# Patient Record
Sex: Male | Born: 1990 | Race: Black or African American | Hispanic: No | Marital: Single | Smoking: Current every day smoker
Health system: Southern US, Community
[De-identification: ages and names within clinical notes are randomized; demographics above are authoritative.]

## PROBLEM LIST (undated history)

## (undated) DIAGNOSIS — F17200 Nicotine dependence, unspecified, uncomplicated: Secondary | ICD-10-CM

## (undated) HISTORY — DX: Nicotine dependence, unspecified, uncomplicated: F17.200

## (undated) HISTORY — PX: FRACTURE SURGERY: SHX138

---
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
## 2009-01-16 ENCOUNTER — Emergency Department: Payer: Self-pay | Admitting: Emergency Medicine

## 2009-12-21 ENCOUNTER — Emergency Department: Payer: Self-pay | Admitting: Emergency Medicine

## 2010-01-23 ENCOUNTER — Emergency Department: Payer: Self-pay | Admitting: Unknown Physician Specialty

## 2010-07-24 ENCOUNTER — Emergency Department: Payer: Self-pay | Admitting: Emergency Medicine
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
## 2021-03-12 DIAGNOSIS — M87052 Idiopathic aseptic necrosis of left femur: Secondary | ICD-10-CM | POA: Insufficient documentation

## 2022-04-30 ENCOUNTER — Ambulatory Visit: Payer: BLUE CROSS/BLUE SHIELD | Admitting: Nurse Practitioner

## 2022-04-30 ENCOUNTER — Encounter: Payer: Self-pay | Admitting: Nurse Practitioner

## 2022-04-30 DIAGNOSIS — Z113 Encounter for screening for infections with a predominantly sexual mode of transmission: Secondary | ICD-10-CM

## 2022-04-30 LAB — HM HEPATITIS C SCREENING LAB: HM Hepatitis Screen: NEGATIVE

## 2022-04-30 LAB — HEPATITIS B SURFACE ANTIGEN: Hepatitis B Surface Ag: NONREACTIVE

## 2022-04-30 LAB — HM HIV SCREENING LAB: HM HIV Screening: NEGATIVE

## 2022-04-30 NOTE — Progress Notes (Signed)
[facility] Department STI clinic/screening visit  Subjective:  Dustin Obrien is a 31 y.o. male being seen today for an STI screening visit. The patient reports they do not have symptoms.    Patient has the following medical conditions:  There are no problems to display for this patient.    Chief Complaint  Patient presents with   SEXUALLY TRANSMITTED DISEASE    Screening    HPI  Patient reports to clinic for STD screening.  Patient is asymptomatic.    Does the patient or their partner desires a pregnancy in the next year? No  Screening for MPX risk: Does the patient have an unexplained rash? No Is the patient MSM? No Does the patient endorse multiple sex partners or anonymous sex partners? No Did the patient have close or sexual contact with a person diagnosed with MPX? No Has the patient traveled outside the [place] where MPX is endemic? No Is there a high clinical suspicion for MPX-- evidenced by one of the following No  -Unlikely to be chickenpox  -Lymphadenopathy  -Rash that present in same phase of evolution on any given body part   See flowsheet for further details and programmatic requirements.   Immunization History  Administered Date(s) Administered   Hepatitis B 08/02/2003, 09/20/2003   Tdap 05/31/2007     The following portions of the patient's history were reviewed and updated as appropriate: allergies, current medications, past medical history, past social history, past surgical history and problem list.  Objective:  There were no vitals filed for this visit.  Physical Exam Constitutional:      Appearance: Normal appearance.  HENT:     Head: Normocephalic. No abrasion, masses or laceration. Hair is normal.     Mouth/Throat:     Mouth: No oral lesions.     Dentition: No dental caries.     Pharynx: No pharyngeal swelling, oropharyngeal exudate, posterior oropharyngeal erythema or uvula swelling.     Tonsils: No tonsillar exudate or  tonsillar abscesses.  Eyes:     General: Lids are normal.        Right eye: No discharge.        Left eye: No discharge.     Conjunctiva/sclera: Conjunctivae normal.     Right eye: No exudate.    Left eye: No exudate. Abdominal:     General: Abdomen is flat.     Palpations: Abdomen is soft.     Tenderness: There is no abdominal tenderness. There is no rebound.  Genitourinary:    Pubic Area: No rash or pubic lice.      Penis: Normal and circumcised. No erythema or discharge.      Testes: Normal.        Right: Mass or tenderness not present.        Left: Mass or tenderness not present.     Rectum: Normal.     Comments: Discharge amount: None Odor: None  Musculoskeletal:     Cervical back: Full passive range of motion without pain, normal range of motion and neck supple.  Lymphadenopathy:     Cervical: No cervical adenopathy.     Right cervical: No superficial, deep or posterior cervical adenopathy.    Left cervical: No superficial, deep or posterior cervical adenopathy.     Upper Body:     Right upper body: No supraclavicular, axillary or epitrochlear adenopathy.     Left upper body: No supraclavicular, axillary or epitrochlear adenopathy.     Lower Body: No  right inguinal adenopathy. No left inguinal adenopathy.  Skin:    General: Skin is warm and dry.     Findings: No lesion or rash.  Neurological:     Mental Status: He is alert and oriented to person, place, and time.  Psychiatric:        Attention and Perception: Attention normal.        Mood and Affect: Mood normal.        Speech: Speech normal.        Behavior: Behavior normal. Behavior is cooperative.       Assessment and Plan:  Dustin Obrien is a 31 y.o. male presenting to the [facility] Department for STI screening  1. Screening examination for venereal disease -31 year old male in clinic today for STD screening. -Patient does not have STI symptoms Patient accepted all screenings including   oral GC,  urine Patient meets criteria for HepB screening? Yes. Ordered? Yes Patient meets criteria for HepC screening? Yes. Ordered? Yes Recommended condom use with all sex Discussed importance of condom use for STI prevent   Discussed time line for State [facility] results and that patient will be called with positive results and encouraged patient to call if he had not heard in 2 weeks Recommended returning for continued or worsening symptoms.    - HIV/HCV [facility] - Syphilis Serology, [facility] - HBV Antigen/Antibody [facility] - Gonococcus culture - Chlamydia/GC NAA, Confirmation     Return if symptoms worsen or fail to improve.    Glenna Fellows, FNP

## 2022-05-03 LAB — CHLAMYDIA/GC NAA, CONFIRMATION
Chlamydia trachomatis, NAA: NEGATIVE
Neisseria gonorrhoeae, NAA: NEGATIVE

## 2022-05-04 LAB — GONOCOCCUS CULTURE

## 2024-01-31 ENCOUNTER — Ambulatory Visit: Payer: Self-pay
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
## 2024-02-01 ENCOUNTER — Encounter: Payer: Self-pay | Admitting: Family Medicine

## 2024-02-01 ENCOUNTER — Ambulatory Visit (INDEPENDENT_AMBULATORY_CARE_PROVIDER_SITE_OTHER): Admitting: Family Medicine

## 2024-02-01 VITALS — BP 125/81 | HR 87 | Ht 66.0 in | Wt 153.4 lb

## 2024-02-01 DIAGNOSIS — M87052 Idiopathic aseptic necrosis of left femur: Secondary | ICD-10-CM

## 2024-02-01 DIAGNOSIS — Z7689 Persons encountering health services in other specified circumstances: Secondary | ICD-10-CM

## 2024-02-01 MED ORDER — MELOXICAM 15 MG PO TABS: 15.0000 mg | ORAL_TABLET | Freq: Every day | ORAL | 0 refills | Status: DC

## 2024-02-01 MED ORDER — TRAMADOL HCL 50 MG PO TABS: 50.0000 mg | ORAL_TABLET | Freq: Four times a day (QID) | ORAL | 0 refills | Status: DC | PRN

## 2024-02-01 NOTE — Patient Instructions (Addendum)
 Tylenol: 1000 mg every 6 hours up to 3 times daily (3000mg  or 3g) as needed   Meloxicam: 15mg  once daily  Do not take with other NSAIDs (ibuprofen/Advil).  Tramadol: pain medication taken as needed     MyChart:  For all urgent or time sensitive needs we ask that you please call the office to avoid delays. Our number is [phone]. MyChart is not constantly monitored and due to the large volume of messages a day, replies may take up to 72 business hours.   MyChart Policy: MyChart allows for you to see your visit notes, after visit summary, provider recommendations, lab and tests results, make an appointment, request refills, and contact your provider or the office for non-urgent questions or concerns. Providers are seeing patients during normal business hours and do not have built in time to review MyChart messages.  We ask that you allow a minimum of 3 business days for responses to [employer]. For this reason, please do not send urgent requests through MyChart. Please call the office at [phone]. New and ongoing conditions may require a visit. We have virtual and in person visit available for your convenience.  Complex MyChart concerns may require a visit. Your provider may request you schedule a virtual or in person visit to ensure we are providing the best care possible. MyChart messages sent after 11:00 AM on Friday will not be received by the provider until Monday morning.    Lab and Test Results: You will receive your lab and test results on MyChart as soon as they are completed and results have been sent by the lab or testing facility. Due to this service, you will receive your results BEFORE your provider.  I review lab and tests results each morning prior to seeing patients. Some results require collaboration with other providers to ensure you are receiving the most appropriate care. For this reason, we ask that you please allow a minimum of 3-5 business days from the  time the ALL results have been received for your provider to receive and review lab and test results and contact you about these.  Most lab and test result comments from the provider will be sent through MyChart. Your provider may recommend changes to the plan of care, follow-up visits, repeat testing, ask questions, or request an office visit to discuss these results. You may reply directly to this message or call the office at [phone] to provide information for the provider or set up an appointment. In some instances, you will be called with test results and recommendations. Please let us know if this is preferred and we will make note of this in your chart to provide this for you.    If you have not heard a response to your lab or test results in 5 business days from all results returning to MyChart, please call the office to let us know. We ask that you please avoid calling prior to this time unless there is an emergent concern. Due to high call volumes, this can delay the resulting process.   After Hours: For all non-emergency after hours needs, please call the office at [phone] and select the option to reach the on-call provider service. On-call services are shared between multiple [place] offices and therefore it will not be possible to speak directly with your provider. On-call providers may provide medical advice and recommendations, but are unable to provide refills for maintenance medications.  For all emergency or urgent medical needs after normal business  hours, we recommend that you seek care at the closest Urgent Care or Emergency Department to ensure appropriate treatment in a timely manner.  [facility] [place] has a 24 hour emergency room located on the ground floor for your convenience.    Urgent Concerns During the Business Day Providers are seeing patients from 8AM to 5PM, Monday through Thursday, and 8AM to 12PM on Friday with a busy schedule and are most  often not able to respond to non-urgent calls until the end of the day or the next business day. If you should have URGENT concerns during the day, please call and speak to the nurse or schedule a same day appointment so that we can address your concern without delay.    Thank you, again, for choosing me as your health care partner. I appreciate your trust and look forward to learning more about you.    Alyson Reedy, FNP-C
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
## 2024-02-07 ENCOUNTER — Other Ambulatory Visit: Payer: Self-pay | Admitting: Family Medicine

## 2024-02-07 DIAGNOSIS — M87052 Idiopathic aseptic necrosis of left femur: Secondary | ICD-10-CM

## 2024-02-09 ENCOUNTER — Other Ambulatory Visit: Payer: Self-pay | Admitting: Family Medicine

## 2024-02-09 DIAGNOSIS — M87052 Idiopathic aseptic necrosis of left femur: Secondary | ICD-10-CM

## 2024-02-09 NOTE — Telephone Encounter (Signed)
 Copied from CRM [id number]. Topic: Clinical - Medication Refill >> Feb 09, 2024  1:41 PM Clayton Bibles wrote: Most Recent Primary Care Visit:  Provider: Alyson Reedy  Department: [facility]  Visit Type: NEW PT - OFFICE VISIT  Date: 02/01/2024  Medication: traMADol (ULTRAM) 50 MG tablet  Has the patient contacted their pharmacy? Yes (Agent: If no, request that the patient contact the pharmacy for the refill. If patient does not wish to contact the pharmacy document the reason why and proceed with request.) (Agent: If yes, when and what did the pharmacy advise?) Pharmacy needs order to refill  Is this the correct pharmacy for this prescription? Yes If no, delete pharmacy and type the correct one.  This is the patient's preferred pharmacy:  [id number] - [place], [place] - [address] [place] [address] Phone: [phone] Fax: [phone]   Has the prescription been filled recently? Yes  Is the patient out of the medication? Yes - He has been out since 02/06/24  Has the patient been seen for an appointment in the last year OR does the patient have an upcoming appointment? Yes  Can we respond through MyChart? Yes - Please send a message through MyChart when completed.   Agent: Please be advised that Rx refills may take up to 3 business days. We ask that you follow-up with your pharmacy.

## 2024-02-10 ENCOUNTER — Telehealth: Payer: Self-pay | Admitting: Family Medicine

## 2024-02-10 MED ORDER — TRAMADOL HCL 50 MG PO TABS: ORAL_TABLET | ORAL | 0 refills | Status: DC

## 2024-02-10 NOTE — Telephone Encounter (Signed)
 Error

## 2024-02-21 ENCOUNTER — Telehealth: Payer: Self-pay

## 2024-02-21 NOTE — Telephone Encounter (Signed)
-----   Message from Dustin Obrien sent at 02/21/2024  4:13 PM EDT ----- Regarding: Ortho Referral What is the status of this patient's ortho referral to [facility] or [facility]? Thanks!

## 2024-02-22 ENCOUNTER — Other Ambulatory Visit: Payer: Self-pay | Admitting: Family Medicine

## 2024-02-22 ENCOUNTER — Telehealth (HOSPITAL_BASED_OUTPATIENT_CLINIC_OR_DEPARTMENT_OTHER): Payer: Self-pay | Admitting: Family Medicine

## 2024-02-22 ENCOUNTER — Telehealth: Payer: Self-pay

## 2024-02-22 DIAGNOSIS — M87052 Idiopathic aseptic necrosis of left femur: Secondary | ICD-10-CM

## 2024-02-22 MED ORDER — MELOXICAM 15 MG PO TABS: 15.0000 mg | ORAL_TABLET | Freq: Every day | ORAL | 0 refills | Status: DC

## 2024-02-22 MED ORDER — TRAMADOL HCL 100 MG PO TABS: 100.0000 mg | ORAL_TABLET | Freq: Four times a day (QID) | ORAL | 0 refills | Status: DC | PRN

## 2024-02-22 NOTE — Telephone Encounter (Signed)
 Patient states he was contacted by Dustin Obrien yesterday and has scheduled for 02/28/24 at 3 pm

## 2024-02-22 NOTE — Telephone Encounter (Signed)
-----   Message from [place] sent at 02/22/2024 10:09 AM EDT ----- Regarding: RE: Ortho Referral Left Message--for someone to call me regarding the referral ----- Message ----- From: Wilhelmena Hanson, FNP Sent: 02/21/2024   4:14 PM EDT To: Chaya Cord Isom; Betsy May; Evans Him, CMA Subject: Ortho Referral                                 What is the status of this patient's ortho referral to [facility]? Thanks!

## 2024-02-22 NOTE — Telephone Encounter (Signed)
 PDMP reviewed, no red flags present. Increased tramadol  from 50mg  to 100mg  Q6 hours. Looking into ortho referral.

## 2024-02-22 NOTE — Telephone Encounter (Signed)
 [facility] called back her name is Howell Macintosh [phone] she said she couldnt make out the patients name please contact

## 2024-02-22 NOTE — Telephone Encounter (Signed)
(  [facility]man)  PA Case ID #: [id number]  Rx #: [id number]   Status Sent to Plan today  Drug traMADol  HCl 100MG  tablets   Form [facility] Electronic Prior Authorization Request Form

## 2024-02-23 NOTE — Telephone Encounter (Signed)
 Patient has an appointment in [facility] for follow up

## 2024-02-23 NOTE — Telephone Encounter (Signed)
 Patient is sch for a follow up in [place]

## 2024-02-24 NOTE — Telephone Encounter (Signed)
 Approved on April 23 by [facility] [employer] [facility] 2017  Approved. Approved for TRAMADOL  HCL Tablet 100MG , quantity up to 50 per 13 days, under the pharmacy benefit.   The drug has been approved from 02/22/2024 to 03/06/2024.   PA Case ID #: [id number] Rx #: [id number]

## 2024-03-06 ENCOUNTER — Telehealth: Payer: Self-pay

## 2024-03-06 NOTE — Telephone Encounter (Signed)
 PA Case ID #: [id number]  Rx #: [id number]  Need Help? Call us  at [phone]  Status Sent to Plan today  Drug traMADol  HCl 100MG  tablets  Form [facility] Electronic Prior Authorization Request Form

## 2024-04-02 ENCOUNTER — Other Ambulatory Visit: Payer: Self-pay | Admitting: Family Medicine

## 2024-04-02 DIAGNOSIS — M87052 Idiopathic aseptic necrosis of left femur: Secondary | ICD-10-CM
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
## 2024-04-25 ENCOUNTER — Ambulatory Visit (INDEPENDENT_AMBULATORY_CARE_PROVIDER_SITE_OTHER): Admitting: Family Medicine

## 2024-04-25 ENCOUNTER — Encounter: Payer: Self-pay | Admitting: Family Medicine

## 2024-04-25 ENCOUNTER — Ambulatory Visit

## 2024-04-25 VITALS — BP 123/80 | HR 66 | Temp 98.3°F | Wt 140.0 lb

## 2024-04-25 DIAGNOSIS — F17200 Nicotine dependence, unspecified, uncomplicated: Secondary | ICD-10-CM | POA: Diagnosis not present

## 2024-04-25 DIAGNOSIS — Z7689 Persons encountering health services in other specified circumstances: Secondary | ICD-10-CM

## 2024-04-25 DIAGNOSIS — Z0283 Encounter for blood-alcohol and blood-drug test: Secondary | ICD-10-CM | POA: Diagnosis not present

## 2024-04-25 DIAGNOSIS — M87052 Idiopathic aseptic necrosis of left femur: Secondary | ICD-10-CM

## 2024-04-25 MED ORDER — MELOXICAM 15 MG PO TABS: 15.0000 mg | ORAL_TABLET | Freq: Every day | ORAL | 2 refills | Status: AC

## 2024-04-25 MED ORDER — TRAMADOL HCL 100 MG PO TABS: 100.0000 mg | ORAL_TABLET | Freq: Two times a day (BID) | ORAL | 0 refills | Status: AC | PRN

## 2024-04-25 MED ORDER — VARENICLINE TARTRATE (STARTER) 0.5 MG X 11 & 1 MG X 42 PO TBPK: ORAL_TABLET | ORAL | 0 refills | Status: DC
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
## 2024-04-25 NOTE — Patient Instructions (Signed)

## 2024-04-26 LAB — CBC WITH DIFFERENTIAL/PLATELET
Basophils Absolute: 0 10*3/uL (ref 0.0–0.2)
Basos: 1 %
EOS (ABSOLUTE): 0.1 10*3/uL (ref 0.0–0.4)
Eos: 2 %
Hematocrit: 41.6 % (ref 37.5–51.0)
Hemoglobin: 13.7 g/dL (ref 13.0–17.7)
Immature Grans (Abs): 0 10*3/uL (ref 0.0–0.1)
Immature Granulocytes: 0 %
Lymphocytes Absolute: 2.4 10*3/uL (ref 0.7–3.1)
Lymphs: 48 %
MCH: 30.5 pg (ref 26.6–33.0)
MCHC: 32.9 g/dL (ref 31.5–35.7)
MCV: 93 fL (ref 79–97)
Monocytes Absolute: 0.3 10*3/uL (ref 0.1–0.9)
Monocytes: 6 %
Neutrophils Absolute: 2.1 10*3/uL (ref 1.4–7.0)
Neutrophils: 43 %
Platelets: 342 10*3/uL (ref 150–450)
RBC: 4.49 x10E6/uL (ref 4.14–5.80)
RDW: 14.1 % (ref 11.6–15.4)
WBC: 4.9 10*3/uL (ref 3.4–10.8)

## 2024-04-26 LAB — LIPID PANEL
Chol/HDL Ratio: 3.1 ratio (ref 0.0–5.0)
Cholesterol, Total: 137 mg/dL (ref 100–199)
HDL: 44 mg/dL (ref >39)
LDL Chol Calc (NIH): 73 mg/dL (ref 0–99)
Triglycerides: 106 mg/dL (ref 0–149)
VLDL Cholesterol Cal: 20 mg/dL (ref 5–40)

## 2024-04-26 LAB — COMPREHENSIVE METABOLIC PANEL WITH GFR
ALT: 14 IU/L (ref 0–44)
AST: 25 IU/L (ref 0–40)
Albumin: 4.5 g/dL (ref 4.1–5.1)
Alkaline Phosphatase: 51 IU/L (ref 44–121)
BUN/Creatinine Ratio: 14 (ref 9–20)
BUN: 13 mg/dL (ref 6–20)
Bilirubin Total: 0.2 mg/dL (ref 0.0–1.2)
CO2: 21 mmol/L (ref 20–29)
Calcium: 9.7 mg/dL (ref 8.7–10.2)
Chloride: 103 mmol/L (ref 96–106)
Creatinine, Ser: 0.9 mg/dL (ref 0.76–1.27)
Globulin, Total: 2.7 g/dL (ref 1.5–4.5)
Glucose: 84 mg/dL (ref 70–99)
Potassium: 4.9 mmol/L (ref 3.5–5.2)
Sodium: 143 mmol/L (ref 134–144)
Total Protein: 7.2 g/dL (ref 6.0–8.5)
eGFR: 116 mL/min/{1.73_m2} (ref >59)

## 2024-04-26 LAB — HEMOGLOBIN A1C
Est. average glucose Bld gHb Est-mCnc: 111 mg/dL
Hgb A1c MFr Bld: 5.5 % (ref 4.8–5.6)

## 2024-04-26 LAB — TSH: TSH: 2.17 u[IU]/mL (ref 0.450–4.500)

## 2024-04-30 ENCOUNTER — Ambulatory Visit: Payer: Self-pay | Admitting: Family Medicine

## 2024-04-30 LAB — COMPREHENSIVE DRUG ANALYSIS,UR

## 2024-05-02 ENCOUNTER — Encounter: Admitting: Family Medicine

## 2024-05-02 ENCOUNTER — Ambulatory Visit: Payer: Self-pay | Admitting: Family Medicine

## 2024-05-10 ENCOUNTER — Encounter: Admitting: Family Medicine

## 2024-05-15 ENCOUNTER — Encounter: Payer: Self-pay | Admitting: Family Medicine

## 2024-05-15 ENCOUNTER — Encounter: Admitting: Family Medicine

## 2024-05-23 ENCOUNTER — Encounter: Payer: Self-pay | Admitting: Family Medicine

## 2024-05-23 ENCOUNTER — Ambulatory Visit (INDEPENDENT_AMBULATORY_CARE_PROVIDER_SITE_OTHER): Admitting: Family Medicine

## 2024-05-23 VITALS — BP 137/77 | HR 83 | Resp 16 | Ht 66.0 in | Wt 143.6 lb

## 2024-05-23 DIAGNOSIS — F17200 Nicotine dependence, unspecified, uncomplicated: Secondary | ICD-10-CM

## 2024-05-23 DIAGNOSIS — M87052 Idiopathic aseptic necrosis of left femur: Secondary | ICD-10-CM

## 2024-05-23 DIAGNOSIS — Z0000 Encounter for general adult medical examination without abnormal findings: Secondary | ICD-10-CM

## 2024-05-23 DIAGNOSIS — Z23 Encounter for immunization: Secondary | ICD-10-CM | POA: Diagnosis not present

## 2024-05-23 MED ORDER — VARENICLINE TARTRATE 1 MG PO TABS: 1.0000 mg | ORAL_TABLET | Freq: Two times a day (BID) | ORAL | 3 refills | Status: DC

## 2024-05-23 NOTE — Progress Notes (Signed)
 /  Established Patient Office Visit  Subjective  Patient ID: Dustin Obrien, male    DOB: [birth year]  Age: 33 y.o. MRN: [id number]  Chief Complaint  Patient presents with   Nicotine Dependence   Pain Management    HPI: Dustin Obrien is a 33 y.o. male who presents for a routine health maintenance exam.  Labs collected at previous visit.   SMOKING CESSATION: 1 pack every day-- currently, smoking 5 cigarettes per day  He doesn't even smoke the entire cigarette. Med regimen 1mg  BID   AVN of L Hip: Pain is constant 9/10 He has just been taking Tylenol and meloxicam   UDS, cocaine +  HEALTH SCREENINGS: - Vision Screening: up to date - Dental Visits: up to date - Testicular Exam: Declined - STD Screening: Declined - PSA (50+): Not applicable  - Colonoscopy (45+): Not applicable  Discussed with patient purpose of the colonoscopy is to detect colon cancer at curable precancerous or early stages  - AAA Screening: Not applicable  Men age 16-75 who have ever smoked - Lung Cancer screening with low-dose CT: Not applicable-  Adults age 62-80 who are current cigarette smokers or quit within the last 15 years. Must have 20 pack year history.   Depression and Anxiety Screen done today and results listed below:     05/23/2024    8:24 AM 04/25/2024    8:38 AM 02/01/2024   10:56 AM  Depression screen PHQ 2/9  Decreased Interest 0 3 0  Down, Depressed, Hopeless 0 0 0  PHQ - 2 Score 0 3 0  Altered sleeping 0 0 0  Tired, decreased energy 0 0 0  Change in appetite 0 0 0  Feeling bad or failure about yourself  0 0 0  Trouble concentrating 0 0 0  Moving slowly or fidgety/restless 0 0 0  Suicidal thoughts 0 0 0  PHQ-9 Score 0 3 0  Difficult doing work/chores Not difficult at all Somewhat difficult Not difficult at all      05/23/2024    8:31 AM 04/25/2024    8:38 AM 02/01/2024   10:56 AM  GAD 7 : Generalized Anxiety Score  Nervous, Anxious, on Edge 1 0 0  Control/stop worrying 1 1  0  Worry too much - different things 1 1 0  Trouble relaxing 1 1 0  Restless 0 0 0  Easily annoyed or irritable 1 1 0  Afraid - awful might happen 0 0 0  Total GAD 7 Score 5 4 0  Anxiety Difficulty Not difficult at all Not difficult at all Not difficult at all    IMMUNIZATIONS:  - Tdap: Tetanus vaccination status reviewed: Td vaccination indicated and given today. - Influenza: N/A - Prevnar: discussed, declined today  - Shingrix vaccine (50+): Not applicable  Past medical history, surgical history, medications, allergies, family history and social history reviewed with patient today and changes made to appropriate areas of the chart.   Past Medical History:  Diagnosis Date   Smoker     Past Surgical History:  Procedure Laterality Date   FRACTURE SURGERY      Current Outpatient Medications on File Prior to Visit  Medication Sig   meloxicam  (MOBIC ) 15 MG tablet Take 1 tablet (15 mg total) by mouth daily.   Varenicline  Tartrate, Starter, (CHANTIX  STARTING MONTH PAK) 0.5 MG X 11 & 1 MG X 42 TBPK Days 1 to 3: 0.5 mg once daily. Days 4 to 7: 0.5 mg twice daily. Maintenance (  day 8 and later): 1 mg twice daily.   No current facility-administered medications on file prior to visit.    No Known Allergies   Social History   Socioeconomic History   Marital status: Single    Spouse name: Not on file   Number of children: Not on file   Years of education: Not on file   Highest education level: Not on file  Occupational History   Not on file  Tobacco Use   Smoking status: Every Day    Current packs/day: 1.00    Average packs/day: 1 pack/day for 20.0 years (20.0 ttl pk-yrs)    Types: Cigarettes    Passive exposure: Current   Smokeless tobacco: Never  Vaping Use   Vaping status: Never Used  Substance and Sexual Activity   Alcohol use: Not Currently    Alcohol/week: 4.0 standard drinks of alcohol    Types: 4 Shots of liquor per week    Comment: twice a week   Drug use:  Not Currently    Comment: history of taking Zanax   Sexual activity: Yes    Birth control/protection: Condom  Other Topics Concern   Not on file  Social History Narrative   Not on file   Social Drivers of Health   Financial Resource Strain: Not on file  Food Insecurity: Not on file  Transportation Needs: Not on file  Physical Activity: Not on file  Stress: Not on file  Social Connections: Unknown (03/11/2022)   Received from [facility]   Social Network    Social Network: Not on file  Intimate Partner Violence: Not At Risk (04/30/2022)   Humiliation, Afraid, Rape, and Kick questionnaire    Fear of Current or Ex-Partner: No    Emotionally Abused: No    Physically Abused: No    Sexually Abused: No   Social History   Tobacco Use  Smoking Status Every Day   Current packs/day: 1.00   Average packs/day: 1 pack/day for 20.0 years (20.0 ttl pk-yrs)   Types: Cigarettes   Passive exposure: Current  Smokeless Tobacco Never   Social History   Substance and Sexual Activity  Alcohol Use Not Currently   Alcohol/week: 4.0 standard drinks of alcohol   Types: 4 Shots of liquor per week   Comment: twice a week   History reviewed. No pertinent family history.   ROS: Denies fever, fatigue, unexplained weight loss/gain, CP, SHOB, and palpatitations. Denies neurological deficits, gastrointestinal and/or genitourinary complaints, and skin changes.   Objective:   Today's Vitals   05/23/24 0824  BP: 137/77  Pulse: 83  Resp: 16  SpO2: 99%  Weight: 143 lb 9.6 oz (65.1 kg)  Height: 5' 6 (1.676 m)  PainSc: 0-No pain    GENERAL APPEARANCE: Well-appearing, in NAD. Well nourished.  SKIN: Pink, warm and dry. Turgor normal. No rash, lesion, ulceration, or ecchymoses. Hair evenly distributed.  HEENT: HEAD: Normocephalic.  EYES: PERRLA. EOMI. Lids intact w/o defect. Sclera white, Conjunctiva pink w/o exudate.  EARS: External ear w/o redness, swelling, masses or lesions. EAC clear.  TM's intact, translucent w/o bulging, appropriate landmarks visualized. Appropriate acuity to conversational tones.  NOSE: Septum midline w/o deformity. Nares patent, mucosa pink and non-inflamed w/o drainage. No sinus tenderness.  THROAT: Uvula midline. Oropharynx clear. Tonsils non-inflamed w/o exudate. Oral mucosa pink and moist.  NECK: Supple, Trachea midline. Full ROM w/o pain or tenderness. No lymphadenopathy. Thyroid non-tender w/o enlargement or palpable masses.  RESPIRATORY: Chest wall symmetrical w/o masses. Respirations even  and non-labored. Breath sounds clear to auscultation bilaterally. No wheezes, rales, rhonchi, or crackles. CARDIAC: S1, S2 present, regular rate and rhythm. No gallops, murmurs, rubs, or clicks. PMI w/o lifts, heaves, or thrills. No carotid bruits. Capillary refill <2 seconds. Peripheral pulses 2+ bilaterally. GI: Abdomen soft w/o distention. Normoactive bowel sounds. No palpable masses or tenderness. No guarding or rebound tenderness. Liver and spleen w/o tenderness or enlargement. No CVA tenderness.  GU: Deferred exam. MSK: Muscle tone and strength appropriate for age, w/o atrophy or abnormal movement. EXTREMITIES: Active ROM intact, w/o tenderness, crepitus, or contracture. No obvious joint deformities or effusions. No clubbing, edema, or cyanosis.  NEUROLOGIC: CN's II-XII intact. Motor strength symmetrical with no obvious weakness. No sensory deficits. DTR 2+ symmetric bilaterally. Steady, even gait.  PSYCH/MENTAL STATUS: Alert, oriented x 3. Cooperative, appropriate mood and affect.   Results for orders placed or performed in visit on 04/25/24  Comprehensive Drug Analysis,Ur   Collection Time: 04/25/24  9:02 AM  Result Value Ref Range   Summary FINAL     Assessment & Plan:   1. Wellness examination (Primary) - Encouraged to adjust caloric intake to maintain or achieve ideal body weight, to reduce intake of dietary saturated fat and total fat, to limit  sodium intake by avoiding high sodium foods and not adding table salt, and to maintain adequate dietary potassium and calcium preferably from fresh fruits, vegetables, and low-fat dairy products.   - Advised to avoid cigarette smoking. - Discussed with the patient that most people either abstain from alcohol or drink within safe limits (<=14/week and <=4 drinks/occasion for males, <=7/weeks and <= 3 drinks/occasion for females) and that the risk for alcohol disorders and other health effects rises proportionally with the number of drinks per week and how often a drinker exceeds daily limits. - Discussed cessation/primary prevention of drug use and availability of treatment for abuse.  - Stressed the importance of regular exercise - Injury prevention: Discussed safety belts, safety helmets, smoke detector, smoking near bedding or upholstery.  - Dental health: Discussed importance of regular tooth brushing, flossing, and dental visits.  - Sexuality: Discussed sexually transmitted diseases, partner selection, use of condoms, avoidance of unintended pregnancy  and contraceptive alternatives.  NEXT PREVENTATIVE PHYSICAL DUE IN 1 YEAR.  2. Tobacco use disorder Patient reports successfully decreasing the amount of cigarettes he is smoking. He is currently taking 1mg  BID without any adverse side effects. Congratulated patient and encouraged him to continue focusing on quitting. Will sned refill for Chantix  1mg  twice daily to pharmacy on file. Plan is to continue this dose for 12 weeks. Advised patient to reach out if his cravings return, as we can increase dose to 1mg  in the morning and 2mg  in the evening. Will follow-up in 6 weeks prior to hip surgery.  - varenicline  (CHANTIX  CONTINUING MONTH PAK) 1 MG tablet; Take 1 tablet (1 mg total) by mouth 2 (two) times daily.  Dispense: 60 tablet; Refill: 3  3. Avascular necrosis of bone of left hip (HCC) Has seen ortho recently on 04/11/2024. Surgery is scheduled for  07/20/2024.   4. Need for diphtheria-tetanus-pertussis (Tdap) vaccine Discussed importance of Tdap immunization, and patient is agreeable to receive this today.   5. Need for prophylactic vaccination against Streptococcus pneumoniae (pneumococcus) Discussed benefits of prophylactic pneumonia immunization due to current tobacco use. Will consider, declines today.   Return in about 6 weeks (around 07/04/2024) for smoking cessation .  Patient to reach out to office if new, worrisome,  or unresolved symptoms arise or if no improvement in patient's condition. Patient verbalized understanding and is agreeable to treatment plan. All questions answered to patient's satisfaction.    Evalene Arts, FNP

## 2024-05-23 NOTE — Patient Instructions (Addendum)
 Smoking Cessation: QuitlineNC 1-800-QUIT-NOW 8035127509); Espaol: 1-855-Djelo-Ya (8-144-664-6430) http://carroll-castaneda.info/      Health Maintenance Recommendations Screening Testing Mammogram Every 1 -2 years based on history and risk factors Starting at age 34 Pap Smear Ages 21-39 every 3 years Ages 24-65 every 5 years with HPV testing More frequent testing may be required based on results and history Colon Cancer Screening Every 1-10 years based on test performed, risk factors, and history Starting at age 24 Bone Density Screening Every 2-10 years based on history Starting at age 63 for women Recommendations for men differ based on medication usage, history, and risk factors AAA Screening One time ultrasound Men 16-33 years old who have every smoked Lung Cancer Screening Low Dose Lung CT every 12 months Age 51-80 years with a 30 pack-year smoking history who still smoke or who have quit within the last 15 years   Screening Labs Routine  Labs: Complete Blood Count (CBC), Complete Metabolic Panel (CMP), Cholesterol (Lipid Panel) Every 6-12 months based on history and medications May be recommended more frequently based on current conditions or previous results Hemoglobin A1c Lab Every 3-12 months based on history and previous results Starting at age 63 or earlier with diagnosis of diabetes, high cholesterol, BMI >26, and/or risk factors Frequent monitoring for patients with diabetes to ensure blood sugar control Thyroid Panel (TSH w/ T3 & T4) Every 6 months based on history, symptoms, and risk factors May be repeated more often if on medication HIV One time testing for all patients 35 and older May be repeated more frequently for patients with increased risk factors or exposure Hepatitis C One time testing for all patients 30 and older May be repeated more frequently for patients with increased risk factors or exposure Gonorrhea, Chlamydia Every 12  months for all sexually active persons 13-24 years Additional monitoring may be recommended for those who are considered high risk or who have symptoms PSA Men 40-26 years old with risk factors Additional screening may be recommended from age 28-69 based on risk factors, symptoms, and history   Vaccine Recommendations Tetanus Booster All adults every 10 years Flu Vaccine All patients 6 months and older every year COVID Vaccine All patients 12 years and older Initial dosing with booster May recommend additional booster based on age and health history HPV Vaccine 2 doses all patients age 60-26 Dosing may be considered for patients over 26 Shingles Vaccine (Shingrix) 2 doses all adults 55 years and older Pneumonia (Pneumovax 23) All adults 65 years and older May recommend earlier dosing based on health history Pneumonia (Prevnar 78) All adults 65 years and older Dosed 1 year after Pneumovax 23   Additional Screening, Testing, and Vaccinations may be recommended on an individualized basis based on family history, health history, risk factors, and/or exposure.  __________________________________________________________   Diet Recommendations for All Patients   I recommend that all patients maintain a diet low in saturated fats, carbohydrates, and cholesterol. While this can be challenging at first, it is not impossible and small changes can make big differences.  Things to try: Decreasing the amount of soda, sweet tea, and/or juice to one or less per day and replace with water While water is always the first choice, if you do not like water you may consider adding a water additive without sugar to improve the taste other sugar free drinks Replace potatoes with a brightly colored vegetable at dinner Use healthy oils, such as canola oil or olive oil, instead of butter or hard margarine Limit  your bread intake to two pieces or less a day Replace regular pasta with low carb pasta  options Bake, broil, or grill foods instead of frying Monitor portion sizes  Eat smaller, more frequent meals throughout the day instead of large meals   An important thing to remember is, if you love foods that are not great for your health, you don't have to give them up completely. Instead, allow these foods to be a reward when you have done well. Allowing yourself to still have special treats every once in a while is a nice way to tell yourself thank you for working hard to keep yourself healthy.    Also remember that every day is a new day. If you have a bad day and fall off the wagon, you can still climb right back up and keep moving along on your journey!   We have resources available to help you!  Some websites that may be helpful include: www.http://www.wall-moore.info/        Www.VeryWellFit.com _____________________________________________________________   Activity Recommendations for All Patients   I recommend that all adults get at least 20 minutes of moderate physical activity that elevates your heart rate at least 5 days out of the week.  Some examples include: Walking or jogging at a pace that allows you to carry on a conversation Cycling (stationary bike or outdoors) Water aerobics Yoga Weight lifting Dancing If physical limitations prevent you from putting stress on your joints, exercise in a pool or seated in a chair are excellent options.   Do determine your MAXIMUM heart rate for activity: YOUR AGE - 220 = MAX HeartRate    Remember! Do not push yourself too hard.  Start slowly and build up your pace, speed, weight, time in exercise, etc.  Allow your body to rest between exercise and get good sleep. You will need more water than normal when you are exerting yourself. Do not wait until you are thirsty to drink. Drink with a purpose of getting in at least 8, 8 ounce glasses of water a day plus more depending on how much you exercise and sweat.      If you begin to develop  dizziness, chest pain, abdominal pain, jaw pain, shortness of breath, headache, vision changes, lightheadedness, or other concerning symptoms, stop the activity and allow your body to rest. If your symptoms are severe, seek emergency evaluation immediately. If your symptoms are concerning, but not severe, please let us  know so that we can recommend further evaluation.

## 2024-05-24 ENCOUNTER — Encounter: Admitting: Family Medicine

## 2024-07-06 ENCOUNTER — Encounter: Payer: Self-pay | Admitting: Family Medicine

## 2024-07-06 ENCOUNTER — Ambulatory Visit (INDEPENDENT_AMBULATORY_CARE_PROVIDER_SITE_OTHER): Admitting: Family Medicine

## 2024-07-06 DIAGNOSIS — F17200 Nicotine dependence, unspecified, uncomplicated: Secondary | ICD-10-CM | POA: Diagnosis not present

## 2024-07-06 NOTE — Patient Instructions (Signed)

## 2024-07-06 NOTE — Progress Notes (Signed)
   Established Patient Office Visit  Subjective  Patient ID: Dustin Obrien, male    DOB: 13-Aug-1991  Age: 33 y.o. MRN: [id number]  Chief Complaint  Patient presents with   Medical Management of Chronic Issues   Discussed the use of AI scribe software for clinical note transcription with the patient, who gave verbal consent to proceed.  History of Present Illness    Dustin Obrien is a 33 year old male who presents for follow-up on smoking cessation.  He is currently taking Chantix  1 mg twice a day to aid in smoking cessation. He has made significant progress, now smoking only one cigarette occasionally, often going several days without smoking. He attributes any smoking to instances when he consumes alcohol, noting that he does not think about cigarettes when he is not drinking. He has been abstinent from alcohol since 9/1.   He is scheduled for hip surgery on July 16, 2024. He has been waiting eight years for this surgery and is taking precautions to avoid illness before the procedure.  Prior to starting Chantix  at the end of July 2025, he was smoking approximately a pack of cigarettes a day. He acknowledges the effectiveness of the medication and describes the cessation process as a 'mental thing'. He has refills available for Chantix .     ROS: see HPI     Objective:     BP 137/83 (BP Location: Left Arm, Patient Position: Sitting, Cuff Size: Normal)   Pulse 64   Temp 98.1 F (36.7 C) (Oral)   Resp 18   Ht 5' 6 (1.676 m)   Wt 143 lb (64.9 kg)   SpO2 98%   BMI 23.08 kg/m  BP Readings from Last 3 Encounters:  07/06/24 137/83  05/23/24 137/77  04/25/24 123/80    Physical Exam Vitals reviewed.  Constitutional:      Appearance: Normal appearance.  Cardiovascular:     Rate and Rhythm: Normal rate and regular rhythm.     Pulses: Normal pulses.     Heart sounds: Normal heart sounds.  Pulmonary:     Effort: Pulmonary effort is normal.     Breath sounds: Normal  breath sounds.  Neurological:     Mental Status: He is alert.  Psychiatric:        Mood and Affect: Mood normal.        Behavior: Behavior normal.     Assessment & Plan:   1. Tobacco use disorder Nicotine dependence improving with Chantix  prescription. He smoked occasionally, mainly with alcohol, and had several smoke-free days. Motivated to quit due to upcoming hip surgery. Aware of smoking risks on surgical outcomes. Continue Chantix  1 mg twice daily. Reassess nicotine dependence in mid-October to determine if continuation of Chantix  is necessary. Encourage avoidance of alcohol to reduce smoking triggers. Advise to avoid smoking to reduce surgical risks and improve healing.  Return in about 6 weeks (around 08/15/2024) for smoking cessation .    Evalene Arts, FNP

## 2024-07-17 NOTE — Progress Notes (Addendum)
 [facility] Department of Physical Therapy   INITIAL CONSULT   PATIENT NAME: Dustin Obrien ADMISSION DATE: 07/16/2024 DATE OF INITIAL EVALUATION: 07/17/24 DURATION OF SESSION: 44 Minutes ROOM: 7112/7112-1  PHYSICAL THERAPY DISCHARGE RECOMMENDATIONS  Current Discharge Recommendation: Home Current DME Recommendation:None                       Is the patient currently safe to discharge (to recommended location) from a PT perspective?  Yes  HISTORY  REASON FOR ADMISSION: Dustin Obrien is a 33 y.o. male admitted [facility] on 07/16/2024 with primary diagnosis of Primary osteoarthritis of left hip. PAST MEDICAL HISTORY: Patient  has no past medical history on file. PAST SURGICAL HISTORY: Patient  has a past surgical history that includes hip (Left, 2017).  PRECAUTIONS   Falls risk, WBAT L LE    SUBJECTIVE     I already feel so much better! I used a walker last time, so I'm used to it! Patient agreeable to mobility during therapy session today. EVALUATION INFORMATION  HOME SET-UP Home type: 1 story house Stairs to enter: 2 shorter deep steps, no HR Bedroom/bathroom: ground floor Lives with: spouse/partner Support system: Patient's wife able to provide support/assist as needed at discharge.   PRIOR LEVEL OF FUNCTION Mobility: Ambulates independently without AD. DME available at home: rolling walker (per pt- ordered and sized by ortho clinic at outpatient pre-op visit) and BSC Falls hx: None Work/hobbies/household responsibilities: Enjoys working out   WHAT MATTERS MOST Per discussion, the patient shared their personal goals for therapy include: Going home   OBJECTIVE: Chart reviewed prior to entering room. Patient cleared for Physical Therapy session. Pt received semi-reclined in bed with his wife present.    Cognition: Pleasant, cooperative, able to follow commands well.  Vitals: VSS  RUE ROM/MMT: WFL LUE ROM/MMT: WFL RLE ROM/MMT:  WFL LLE ROM/MMT: Grossly WFL  Sensation: intact to LT B LEs  Mobility Summary  Bed mobility: Sup<>sit with SPV. Balance: Good static and dynamic sitting balance at EOB with SBA. Good static standing balance with B UE support and SBA. Transfers: SIt<>stand to/from EOB and recliner chair with RW and SBA. Gait: Pt ambulated ~110' x 2 with RW and SBA (seated break and steps performed in between). Pt demonstrated step through gait pattern with appropriate sequencing, slightly decreased stance time L LE. No LOB. Pt also trialed lofstrand crutches, ambulating ~25' with LSC and SBA. Pt steady with appropriate sequencing and no LOB. Pt educated on benefits of LSCs vs RW- pt reports preference for RW at this time, as he states he is more comfortable with it and has used RW in the past. Pt has RW given to him by ortho clinic prior to surgery. Stairs: Pt given verbal instruction on safe stair technique. Pt ascended and descended 3 steps with RW and SBA. Min VC for technique. Pt also performed with B LSCs, min VC for technique, and SBA prior to indicating preference for RW.   Pt educated on and performed therex including APs, QS, HS, hip abd/adduction, and GS. Pt has copy of exercises in forward motion packet.   Pt also educated on WBAT L LE, general hip precautions and implications with mobility, importance of continued use of AD, how often to ambulate at home, not overdoing it, safe car transfer technique, and safe home set-up. Pt and his wife verbalized understanding and deny further questions/ concerns.  Patient was left semi-reclined in bed, with family present,  with all needs in reach, with nurse call device in reach.   Pt has the following impairments: decreased endurance/activity tolerance, decreased ROM/flexibility, gait/ambulation limitations, and pain.   PLAN: Based on objective findings and patient's personal goals, Dustin Obrien will benefit from continued acute physical therapy. Will  continue to follow  . Pt would benefit from discharge from acute care to Home with family support/assist as needed.   THERAPY GOALS   Physical Therapy Goals     * Physical Therapy Goals (Active)     There are no active problems.             * Physical Therapy Goals (Resolved)     There are no resolved problems.             On 07/17/2024,  has discussed with patient/family/caregiver if they have the help they will need when they leave the hospital  Please see PT overview for Patient Education  Dustin Obrien, PT Rehabilitation Services [facility]  *Some images could not be shown.
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
## 2024-08-17 ENCOUNTER — Encounter: Payer: Self-pay | Admitting: Family Medicine

## 2024-08-17 ENCOUNTER — Ambulatory Visit: Admitting: Family Medicine

## 2024-08-17 DIAGNOSIS — F17211 Nicotine dependence, cigarettes, in remission: Secondary | ICD-10-CM

## 2024-08-17 MED ORDER — VARENICLINE TARTRATE 1 MG PO TABS: 1.0000 mg | ORAL_TABLET | Freq: Two times a day (BID) | ORAL | 0 refills | Status: AC

## 2024-08-17 NOTE — Progress Notes (Signed)
   Established Patient Office Visit  Subjective  Patient ID: Dustin Obrien, male    DOB: [birth year]  Age: 33 y.o. MRN: [id number]  Chief Complaint  Patient presents with   Nicotine Dependence   Discussed the use of AI scribe software for clinical note transcription with the patient, who gave verbal consent to proceed.  History of Present Illness   Dustin Obrien is a 33 year old male who presents for a smoking cessation follow-up.  He has significantly reduced his smoking, especially after his recent surgery, and abstained from smoking for two weeks post-operatively. He experiences cravings primarily when consuming alcohol, during which he might smoke a few puffs but does not finish an entire cigarette. He can abstain from smoking for up to five days if he avoids alcohol, which he mostly consumes on the weekends. He has been on Chantix  for twelve weeks to aid in smoking cessation. No chest pain, shortness of breath, or wheezing.   He underwent L hip replacement recently (9/15) and is recovering well. He experiences tightness rather than pain post-operatively and has not been taking pain medications. He was prescribed pain medication but discontinued it about a week after surgery due to concerns about addiction. He has some residual swelling and difficulty bending the affected area but has not required physical therapy. He stopped using a walker approximately two weeks post-surgery.  ROS: see HPI     Objective:     BP 132/78   Pulse 74   Resp 16   Ht 5' 6 (1.676 m)   Wt 146 lb (66.2 kg)   BMI 23.57 kg/m  BP Readings from Last 3 Encounters:  08/17/24 132/78  07/06/24 137/83  05/23/24 137/77     Physical Exam Vitals reviewed.  Constitutional:      Appearance: Normal appearance.  Cardiovascular:     Rate and Rhythm: Normal rate and regular rhythm.     Pulses: Normal pulses.     Heart sounds: Normal heart sounds.  Pulmonary:     Effort: Pulmonary effort is normal.      Breath sounds: Normal breath sounds.  Neurological:     Mental Status: He is alert.  Psychiatric:        Mood and Affect: Mood normal.        Behavior: Behavior normal.     Assessment & Plan:   1. Cigarette nicotine dependence in remission Current nicotine dependence managed with Chantix , showing significant smoking reduction. Cravings linked to alcohol consumption. Desires continued pharmacotherapy to prevent relapse for additional 12 weeks. Continue Chantix  for additional 12 weeks, twice daily. Rx sent to pharmacy on file.  - varenicline  (CHANTIX  CONTINUING MONTH PAK) 1 MG tablet; Take 1 tablet (1 mg total) by mouth 2 (two) times daily.  Dispense: 180 tablet; Refill: 0    Return in about 3 months (around 11/17/2024) for smoking cessation f/u .    Evalene Arts, FNP

## 2024-11-16 ENCOUNTER — Encounter: Payer: Self-pay | Admitting: Family Medicine

## 2024-11-16 ENCOUNTER — Ambulatory Visit (INDEPENDENT_AMBULATORY_CARE_PROVIDER_SITE_OTHER): Admitting: Family Medicine

## 2024-11-16 VITALS — BP 125/78 | HR 64 | Resp 16 | Ht 66.0 in

## 2024-11-16 DIAGNOSIS — F17200 Nicotine dependence, unspecified, uncomplicated: Secondary | ICD-10-CM

## 2024-11-16 DIAGNOSIS — L2084 Intrinsic (allergic) eczema: Secondary | ICD-10-CM | POA: Diagnosis not present

## 2024-11-16 MED ORDER — TRIAMCINOLONE ACETONIDE 0.1 % EX CREA: 1.0000 | TOPICAL_CREAM | Freq: Two times a day (BID) | CUTANEOUS | 0 refills | Status: AC

## 2024-11-16 MED ORDER — VARENICLINE TARTRATE 1 MG PO TABS: 1.0000 mg | ORAL_TABLET | Freq: Two times a day (BID) | ORAL | 2 refills | Status: AC

## 2024-11-16 NOTE — Patient Instructions (Signed)
 CeraVe or Aquaphor for moisturizing  Use triamcinolone  (steroid) cream for inflammation until it resolves

## 2024-11-16 NOTE — Progress Notes (Signed)
" ° °  Established Patient Office Visit  Subjective  Patient ID: Dustin Obrien, male    DOB: 24-Apr-1991  Age: 34 y.o. MRN: [id number]  Chief Complaint  Patient presents with   Nicotine Dependence   Discussed the use of AI scribe software for clinical note transcription with the patient, who gave verbal consent to proceed.  History of Present Illness   Dustin Obrien is a 34 year old male who presents for smoking cessation and concerns about post-surgical numbness and rash on the leg.  He has reduced his smoking to about four or five cigarettes a day, attributing it to stress rather than alcohol consumption, although he sometimes desires a cigarette when drinking. He previously used Chantix , which he found helpful, but stopped taking it around October 2025, as he never picked up his prescription, leading to a resurgence in his urge to smoke.   He is experiencing numbness and irritation in his left leg following surgery. The area remains 'real numb' and 'tender to touch,' with dry spots but no itching. He recalls being informed that the numbness might persist for an unspecified duration. He reports no itching, but describes the area as irritating.      ROS: see HPI     Objective:     BP 125/78   Pulse 64   Resp 16   Ht 5' 6 (1.676 m)   SpO2 98%   BMI 23.57 kg/m  BP Readings from Last 3 Encounters:  11/16/24 125/78  08/17/24 132/78  07/06/24 137/83    Physical Exam Vitals reviewed.  Constitutional:      Appearance: Normal appearance.  Cardiovascular:     Rate and Rhythm: Normal rate and regular rhythm.     Pulses: Normal pulses.     Heart sounds: Normal heart sounds.  Pulmonary:     Effort: Pulmonary effort is normal.     Breath sounds: Normal breath sounds.  Skin:    General: Skin is warm and dry.     Findings: Rash (L lateral thigh near healed surgical incision) present.      Neurological:     Mental Status: He is alert.  Psychiatric:        Mood and Affect:  Mood normal.        Behavior: Behavior normal.       Assessment & Plan:   1. Tobacco use disorder (Primary) Continues to smoke 4-5 cigarettes daily, reduced from half a pack. Smoking linked to stress. Previously benefited from Chantix , discontinued in October, leading to urges. Desires to quit and open to restarting Chantix . Prescribed Chantix  with two refills for twelve weeks. Advised setting a quit date between January 23rd and February 16th. Sent prescription to pharmacy. Follow-up in 12 weeks.  - varenicline  (CHANTIX  CONTINUING MONTH PAK) 1 MG tablet; Take 1 tablet (1 mg total) by mouth 2 (two) times daily.  Dispense: 60 tablet; Refill: 2  2. Intrinsic eczema Reports dry, tender spots on leg post-surgery with numbness and irritation. No infection signs. Differential includes eczema, psoriasis, and dermatitis. Prescribed steroid cream for affected area, cautioned about potential skin lightening. Recommended moisturizing cream such as Aquaphor or CeraVe. Advised follow-up with surgeon for persistent numbness.  - triamcinolone  cream (KENALOG ) 0.1 %; Apply 1 Application topically 2 (two) times daily.  Dispense: 30 g; Refill: 0   Return in about 3 months (around 02/14/2025) for smoking cessation .    Evalene Arts, FNP "

## 2025-02-15 ENCOUNTER — Ambulatory Visit: Admitting: Family Medicine
